# Patient Record
Sex: Female | Born: 1980 | Hispanic: No | Marital: Single | Smoking: Never smoker
Health system: Southern US, Community
[De-identification: ages and names within clinical notes are randomized; demographics above are authoritative.]

## PROBLEM LIST (undated history)

## (undated) DIAGNOSIS — O09529 Supervision of elderly multigravida, unspecified trimester: Secondary | ICD-10-CM

## (undated) DIAGNOSIS — Z8661 Personal history of infections of the central nervous system: Secondary | ICD-10-CM

## (undated) DIAGNOSIS — Z8619 Personal history of other infectious and parasitic diseases: Secondary | ICD-10-CM

## (undated) DIAGNOSIS — R87629 Unspecified abnormal cytological findings in specimens from vagina: Secondary | ICD-10-CM

## (undated) DIAGNOSIS — A609 Anogenital herpesviral infection, unspecified: Secondary | ICD-10-CM

## (undated) DIAGNOSIS — R112 Nausea with vomiting, unspecified: Secondary | ICD-10-CM

## (undated) DIAGNOSIS — F419 Anxiety disorder, unspecified: Secondary | ICD-10-CM

## (undated) DIAGNOSIS — M545 Low back pain, unspecified: Secondary | ICD-10-CM

## (undated) DIAGNOSIS — F40240 Claustrophobia: Secondary | ICD-10-CM

## (undated) DIAGNOSIS — Z9889 Other specified postprocedural states: Secondary | ICD-10-CM

## (undated) DIAGNOSIS — D649 Anemia, unspecified: Secondary | ICD-10-CM

## (undated) HISTORY — DX: Anogenital herpesviral infection, unspecified: A60.9

## (undated) HISTORY — DX: Supervision of elderly multigravida, unspecified trimester: O09.529

## (undated) HISTORY — DX: Unspecified abnormal cytological findings in specimens from vagina: R87.629

## (undated) HISTORY — PX: COLPOSCOPY: SHX161

## (undated) HISTORY — DX: Personal history of other infectious and parasitic diseases: Z86.19

## (undated) HISTORY — DX: Personal history of infections of the central nervous system: Z86.61

## (undated) HISTORY — DX: Other specified postprocedural states: Z98.890

---
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
## 2012-04-13 DEATH — deceased

## 2015-04-19 LAB — OB RESULTS CONSOLE GC/CHLAMYDIA
Chlamydia: NEGATIVE
Gonorrhea: NEGATIVE

## 2015-04-19 LAB — OB RESULTS CONSOLE RPR: RPR: NONREACTIVE

## 2015-04-19 LAB — OB RESULTS CONSOLE HEPATITIS B SURFACE ANTIGEN: HEP B S AG: NEGATIVE

## 2015-04-19 LAB — OB RESULTS CONSOLE HIV ANTIBODY (ROUTINE TESTING): HIV: NONREACTIVE

## 2015-04-19 LAB — OB RESULTS CONSOLE ABO/RH: RH TYPE: POSITIVE

## 2015-04-19 LAB — OB RESULTS CONSOLE ANTIBODY SCREEN: Antibody Screen: NEGATIVE

## 2015-04-19 LAB — OB RESULTS CONSOLE RUBELLA ANTIBODY, IGM: RUBELLA: IMMUNE

## 2015-08-14 NOTE — L&D Delivery Note (Signed)
Vtx at +3 station and ROA.  Pt desires VE.  I discussed the R&B of VE including but not limited to injury to fetus but the potential benefit of expedited delivery.  She gives her informed consent and wishes to proceed. On the 3rd pull delivered viable female apgars 8,9 over 2nd degree ML lac   Placenta delivered spontaneously intact with 3VC. Repair with 2-0 Chromic with good support and hemostasis noted and R/V exam confirms.  PH art was sent.  [place] cord blood was not done.  Mother and baby were doing well.  EBL 250cc  Candice Campavid Wille Aubuchon, MD

## 2015-10-20 LAB — OB RESULTS CONSOLE GBS: GBS: NEGATIVE

## 2015-11-04 ENCOUNTER — Telehealth (HOSPITAL_COMMUNITY): Payer: Self-pay | Admitting: *Deleted

## 2015-11-04 ENCOUNTER — Encounter (HOSPITAL_COMMUNITY): Payer: Self-pay | Admitting: *Deleted

## 2015-11-04 ENCOUNTER — Encounter (HOSPITAL_COMMUNITY): Payer: Self-pay

## 2015-11-04 NOTE — Telephone Encounter (Signed)
Preadmission screen  

## 2015-11-09 ENCOUNTER — Encounter (HOSPITAL_COMMUNITY): Payer: Self-pay | Admitting: *Deleted

## 2015-11-09 ENCOUNTER — Inpatient Hospital Stay (HOSPITAL_COMMUNITY)
Admission: AD | Admit: 2015-11-09 | Discharge: 2015-11-09 | Disposition: A | Discharge status: Home / Self Care | Payer: Managed Care, Other (non HMO) | Source: Ambulatory Visit | Attending: Obstetrics and Gynecology | Admitting: Obstetrics and Gynecology

## 2015-11-09 DIAGNOSIS — Z3493 Encounter for supervision of normal pregnancy, unspecified, third trimester: Secondary | ICD-10-CM | POA: Diagnosis present

## 2015-11-09 NOTE — Discharge Instructions (Signed)
Keep your scheduled appointment for prenatal care and/or C/S. Call the office or provider on call with further concerns or return to MAU as needed.

## 2015-11-09 NOTE — MAU Note (Signed)
Urine in lab 

## 2015-11-09 NOTE — MAU Note (Signed)
woke up at 0330 with pain in lower right side of back, down into hip. Next pain was at 0430.  Not as strong as it was initially.  Denies bleeding or leaking.  For Rc/s on Friday

## 2015-11-10 ENCOUNTER — Inpatient Hospital Stay (HOSPITAL_COMMUNITY)
Admission: RE | Admit: 2015-11-10 | Discharge: 2015-11-10 | Disposition: A | Discharge status: Home / Self Care | Payer: Self-pay | Source: Ambulatory Visit

## 2015-11-10 ENCOUNTER — Inpatient Hospital Stay (HOSPITAL_COMMUNITY): Payer: Managed Care, Other (non HMO) | Admitting: Anesthesiology

## 2015-11-10 ENCOUNTER — Inpatient Hospital Stay (HOSPITAL_COMMUNITY)
Admission: AD | Admit: 2015-11-10 | Discharge: 2015-11-12 | DRG: 774 | Disposition: A | Discharge status: Home / Self Care | Payer: Managed Care, Other (non HMO) | Source: Ambulatory Visit | Attending: Obstetrics and Gynecology | Admitting: Obstetrics and Gynecology

## 2015-11-10 ENCOUNTER — Encounter (HOSPITAL_COMMUNITY): Payer: Self-pay | Admitting: *Deleted

## 2015-11-10 ENCOUNTER — Inpatient Hospital Stay (HOSPITAL_COMMUNITY): Payer: Managed Care, Other (non HMO)

## 2015-11-10 DIAGNOSIS — Z818 Family history of other mental and behavioral disorders: Secondary | ICD-10-CM

## 2015-11-10 DIAGNOSIS — Z3689 Encounter for other specified antenatal screening: Secondary | ICD-10-CM

## 2015-11-10 DIAGNOSIS — O4202 Full-term premature rupture of membranes, onset of labor within 24 hours of rupture: Secondary | ICD-10-CM | POA: Diagnosis present

## 2015-11-10 DIAGNOSIS — Z8661 Personal history of infections of the central nervous system: Secondary | ICD-10-CM

## 2015-11-10 DIAGNOSIS — A6000 Herpesviral infection of urogenital system, unspecified: Secondary | ICD-10-CM | POA: Diagnosis present

## 2015-11-10 DIAGNOSIS — O9832 Other infections with a predominantly sexual mode of transmission complicating childbirth: Secondary | ICD-10-CM | POA: Diagnosis present

## 2015-11-10 DIAGNOSIS — O34211 Maternal care for low transverse scar from previous cesarean delivery: Secondary | ICD-10-CM | POA: Diagnosis present

## 2015-11-10 DIAGNOSIS — Z3A39 39 weeks gestation of pregnancy: Secondary | ICD-10-CM

## 2015-11-10 DIAGNOSIS — Z833 Family history of diabetes mellitus: Secondary | ICD-10-CM | POA: Diagnosis not present

## 2015-11-10 DIAGNOSIS — IMO0001 Reserved for inherently not codable concepts without codable children: Secondary | ICD-10-CM

## 2015-11-10 DIAGNOSIS — Z8249 Family history of ischemic heart disease and other diseases of the circulatory system: Secondary | ICD-10-CM

## 2015-11-10 LAB — TYPE AND SCREEN
ABO/RH(D): A POS
ANTIBODY SCREEN: NEGATIVE

## 2015-11-10 LAB — CBC
HEMATOCRIT: 32.8 % — AB (ref 36.0–46.0)
HEMOGLOBIN: 11.4 g/dL — AB (ref 12.0–15.0)
MCH: 31.9 pg (ref 26.0–34.0)
MCHC: 34.8 g/dL (ref 30.0–36.0)
MCV: 91.9 fL (ref 78.0–100.0)
Platelets: 186 10*3/uL (ref 150–400)
RBC: 3.57 MIL/uL — ABNORMAL LOW (ref 3.87–5.11)
RDW: 13.7 % (ref 11.5–15.5)
WBC: 10.1 10*3/uL (ref 4.0–10.5)

## 2015-11-10 LAB — RPR: RPR: NONREACTIVE

## 2015-11-10 LAB — ABO/RH: ABO/RH(D): A POS

## 2015-11-10 MED ORDER — OXYCODONE-ACETAMINOPHEN 5-325 MG PO TABS: 2.0000 | ORAL_TABLET | ORAL | Status: DC | PRN

## 2015-11-10 MED ORDER — PHENYLEPHRINE 40 MCG/ML (10ML) SYRINGE FOR IV PUSH (FOR BLOOD PRESSURE SUPPORT)
80.0000 ug | PREFILLED_SYRINGE | INTRAVENOUS | Status: DC | PRN
  Filled 2015-11-10: qty 2
  Filled 2015-11-10: qty 20

## 2015-11-10 MED ORDER — ONDANSETRON HCL 4 MG/2ML IJ SOLN: 4.0000 mg | INTRAMUSCULAR | Status: DC | PRN

## 2015-11-10 MED ORDER — SENNOSIDES-DOCUSATE SODIUM 8.6-50 MG PO TABS
2.0000 | ORAL_TABLET | ORAL | Status: DC
  Administered 2015-11-10 – 2015-11-11 (×2): 2 via ORAL
  Filled 2015-11-10 (×2): qty 2

## 2015-11-10 MED ORDER — BENZOCAINE-MENTHOL 20-0.5 % EX AERO
1.0000 "application " | INHALATION_SPRAY | CUTANEOUS | Status: DC | PRN
  Filled 2015-11-10: qty 56

## 2015-11-10 MED ORDER — LACTATED RINGERS IV SOLN
INTRAVENOUS | Status: DC
  Administered 2015-11-10: 09:00:00 via INTRAVENOUS

## 2015-11-10 MED ORDER — ONDANSETRON HCL 4 MG/2ML IJ SOLN: 4.0000 mg | Freq: Four times a day (QID) | INTRAMUSCULAR | Status: DC | PRN

## 2015-11-10 MED ORDER — LACTATED RINGERS IV SOLN: 500.0000 mL | Freq: Once | INTRAVENOUS | Status: DC

## 2015-11-10 MED ORDER — EPHEDRINE 5 MG/ML INJ
10.0000 mg | INTRAVENOUS | Status: DC | PRN
  Filled 2015-11-10: qty 2

## 2015-11-10 MED ORDER — IBUPROFEN 600 MG PO TABS
600.0000 mg | ORAL_TABLET | Freq: Four times a day (QID) | ORAL | Status: DC
  Administered 2015-11-10 – 2015-11-12 (×7): 600 mg via ORAL
  Filled 2015-11-10 (×7): qty 1

## 2015-11-10 MED ORDER — OXYCODONE-ACETAMINOPHEN 5-325 MG PO TABS
1.0000 | ORAL_TABLET | ORAL | Status: DC | PRN
  Filled 2015-11-10: qty 1

## 2015-11-10 MED ORDER — DIPHENHYDRAMINE HCL 50 MG/ML IJ SOLN: 12.5000 mg | INTRAMUSCULAR | Status: DC | PRN

## 2015-11-10 MED ORDER — ACETAMINOPHEN 325 MG PO TABS: 650.0000 mg | ORAL_TABLET | ORAL | Status: DC | PRN

## 2015-11-10 MED ORDER — MEDROXYPROGESTERONE ACETATE 150 MG/ML IM SUSP
150.0000 mg | INTRAMUSCULAR | Status: DC | PRN
Start: 2015-11-10 — End: 2015-11-12

## 2015-11-10 MED ORDER — SIMETHICONE 80 MG PO CHEW: 80.0000 mg | CHEWABLE_TABLET | ORAL | Status: DC | PRN

## 2015-11-10 MED ORDER — TETANUS-DIPHTH-ACELL PERTUSSIS 5-2.5-18.5 LF-MCG/0.5 IM SUSP: 0.5000 mL | Freq: Once | INTRAMUSCULAR | Status: DC

## 2015-11-10 MED ORDER — LIDOCAINE HCL (PF) 1 % IJ SOLN
INTRAMUSCULAR | Status: DC | PRN
  Administered 2015-11-10 (×2): 4 mL

## 2015-11-10 MED ORDER — ACETAMINOPHEN 325 MG PO TABS
650.0000 mg | ORAL_TABLET | ORAL | Status: DC | PRN
  Administered 2015-11-11: 650 mg via ORAL
  Filled 2015-11-10: qty 2

## 2015-11-10 MED ORDER — LIDOCAINE HCL (PF) 1 % IJ SOLN
30.0000 mL | INTRAMUSCULAR | Status: DC | PRN
  Filled 2015-11-10: qty 30

## 2015-11-10 MED ORDER — PHENYLEPHRINE 40 MCG/ML (10ML) SYRINGE FOR IV PUSH (FOR BLOOD PRESSURE SUPPORT)
80.0000 ug | PREFILLED_SYRINGE | INTRAVENOUS | Status: DC | PRN
  Filled 2015-11-10: qty 2

## 2015-11-10 MED ORDER — DIBUCAINE 1 % RE OINT: 1.0000 "application " | TOPICAL_OINTMENT | RECTAL | Status: DC | PRN

## 2015-11-10 MED ORDER — OXYTOCIN BOLUS FROM INFUSION
500.0000 mL | INTRAVENOUS | Status: DC
  Administered 2015-11-10: 500 mL via INTRAVENOUS

## 2015-11-10 MED ORDER — ZOLPIDEM TARTRATE 5 MG PO TABS: 5.0000 mg | ORAL_TABLET | Freq: Every evening | ORAL | Status: DC | PRN

## 2015-11-10 MED ORDER — MEASLES, MUMPS & RUBELLA VAC ~~LOC~~ INJ
0.5000 mL | INJECTION | Freq: Once | SUBCUTANEOUS | Status: DC
Start: 2015-11-11 — End: 2015-11-11

## 2015-11-10 MED ORDER — DIPHENHYDRAMINE HCL 25 MG PO CAPS: 25.0000 mg | ORAL_CAPSULE | Freq: Four times a day (QID) | ORAL | Status: DC | PRN

## 2015-11-10 MED ORDER — FENTANYL 2.5 MCG/ML BUPIVACAINE 1/10 % EPIDURAL INFUSION (WH - ANES)
14.0000 mL/h | INTRAMUSCULAR | Status: DC | PRN
  Administered 2015-11-10 (×3): 14 mL/h via EPIDURAL
  Filled 2015-11-10 (×2): qty 125

## 2015-11-10 MED ORDER — LANOLIN HYDROUS EX OINT: TOPICAL_OINTMENT | CUTANEOUS | Status: DC | PRN

## 2015-11-10 MED ORDER — LACTATED RINGERS IV SOLN: 500.0000 mL | INTRAVENOUS | Status: DC | PRN

## 2015-11-10 MED ORDER — CITRIC ACID-SODIUM CITRATE 334-500 MG/5ML PO SOLN: 30.0000 mL | ORAL | Status: DC | PRN

## 2015-11-10 MED ORDER — OXYTOCIN 10 UNIT/ML IJ SOLN
2.5000 [IU]/h | INTRAVENOUS | Status: DC
  Filled 2015-11-10: qty 10

## 2015-11-10 MED ORDER — ONDANSETRON HCL 4 MG PO TABS: 4.0000 mg | ORAL_TABLET | ORAL | Status: DC | PRN

## 2015-11-10 MED ORDER — WITCH HAZEL-GLYCERIN EX PADS: 1.0000 "application " | MEDICATED_PAD | CUTANEOUS | Status: DC | PRN

## 2015-11-10 MED ORDER — PRENATAL MULTIVITAMIN CH
1.0000 | ORAL_TABLET | Freq: Every day | ORAL | Status: DC
  Administered 2015-11-11 – 2015-11-12 (×2): 1 via ORAL
  Filled 2015-11-10 (×2): qty 1

## 2015-11-10 MED ORDER — OXYCODONE-ACETAMINOPHEN 5-325 MG PO TABS: 1.0000 | ORAL_TABLET | ORAL | Status: DC | PRN

## 2015-11-10 NOTE — MAU Note (Signed)
Pt may go to room 164 after shift change.

## 2015-11-10 NOTE — Anesthesia Procedure Notes (Signed)
Epidural Patient location during procedure: OB  Staffing Anesthesiologist: Shenae Bonanno Performed by: anesthesiologist   Preanesthetic Checklist Completed: patient identified, site marked, surgical consent, pre-op evaluation, timeout performed, IV checked, risks and benefits discussed and monitors and equipment checked  Epidural Patient position: sitting Prep: site prepped and draped and DuraPrep Patient monitoring: continuous pulse ox and blood pressure Approach: midline Location: L3-L4 Injection technique: LOR saline  Needle:  Needle type: Tuohy  Needle gauge: 17 G Needle length: 9 cm and 9 Needle insertion depth: 6.5 cm Catheter type: closed end flexible Catheter size: 19 Gauge Catheter at skin depth: 11 cm Test dose: negative  Assessment Events: blood not aspirated, injection not painful, no injection resistance, negative IV test and no paresthesia  Additional Notes Patient identified. Risks/Benefits/Options discussed with patient including but not limited to bleeding, infection, nerve damage, paralysis, failed block, incomplete pain control, headache, blood pressure changes, nausea, vomiting, reactions to medication both or allergic, itching and postpartum back pain. Confirmed with bedside nurse the patient's most recent platelet count. Confirmed with patient that they are not currently taking any anticoagulation, have any bleeding history or any family history of bleeding disorders. Patient expressed understanding and wished to proceed. All questions were answered. Sterile technique was used throughout the entire procedure. Please see nursing notes for vital signs. Test dose was given through epidural catheter and negative prior to continuing to dose epidural or start infusion. Warning signs of high block given to the patient including shortness of breath, tingling/numbness in hands, complete motor block, or any concerning symptoms with instructions to call for help. Patient was  given instructions on fall risk and not to get out of bed. All questions and concerns addressed with instructions to call with any issues or inadequate analgesia.

## 2015-11-10 NOTE — Anesthesia Preprocedure Evaluation (Signed)
Anesthesia Evaluation  Patient identified by MRN, date of birth, ID band Patient awake    Reviewed: Allergy & Precautions, NPO status , Patient's Chart, lab work & pertinent test results  History of Anesthesia Complications Negative for: history of anesthetic complications  Airway Mallampati: II  TM Distance: >3 FB Neck ROM: Full    Dental no notable dental hx. (+) Dental Advisory Given   Pulmonary neg pulmonary ROS,    Pulmonary exam normal breath sounds clear to auscultation       Cardiovascular negative cardio ROS Normal cardiovascular exam Rhythm:Regular Rate:Normal     Neuro/Psych negative neurological ROS  negative psych ROS   GI/Hepatic negative GI ROS, Neg liver ROS,   Endo/Other  orders  Renal/GU negative Renal ROS  negative genitourinary   Musculoskeletal negative musculoskeletal ROS (+)   Abdominal   Peds negative pediatric ROS (+)  Hematology negative hematology ROS (+)   Anesthesia Other Findings   Reproductive/Obstetrics (+) Pregnancy Prior C/S x 1                             Anesthesia Physical Anesthesia Plan  ASA: II  Anesthesia Plan: Epidural   Post-op Pain Management:    Induction:   Airway Management Planned:   Additional Equipment:   Intra-op Plan:   Post-operative Plan:   Informed Consent: I have reviewed the patients History and Physical, chart, labs and discussed the procedure including the risks, benefits and alternatives for the proposed anesthesia with the patient or authorized representative who has indicated his/her understanding and acceptance.   Dental advisory given  Plan Discussed with: CRNA  Anesthesia Plan Comments:         Anesthesia Quick Evaluation

## 2015-11-10 NOTE — MAU Note (Addendum)
PT  SAYS SROM  AT 0320.    SAYS UC  HURT   BAD   AT  0315.   VE IN MAU YESTERDAY-   CLOSED    HAS HX  OF  HSV-  NEVER  HAD OUTBREAK-   TAKES  ACYCLOVIR,   GBS- NEG       IS  SCH  C/S    UNLESS GOES INTO  LABOR-   SO  NOW PLAN  IS  VAG  WITH  EPIDURAL

## 2015-11-10 NOTE — Progress Notes (Signed)
Darcus Austinarole Michelaski, RN spoke will BS charge rn for status update of room for patient.  Patient will be next to go.

## 2015-11-10 NOTE — H&P (Signed)
Becky Ford is a 35 y.o. female presenting for SROM at 0320 clear fluid and labor sxs.  Preg complicated by ama with normal NIPT and prev c/s scheduled for repeat tomorrow, but wants TOL now.  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  GBS-. History OB History    Gravida Para Term Preterm AB TAB SAB Ectopic Multiple Living   3    1 1    1      Past Medical History  Diagnosis Date  . Vaginal Pap smear, abnormal   . HSV (herpes simplex virus) anogenital infection   . Hx of viral meningitis   . Hx of varicella   . AMA (advanced maternal age) multigravida 35+    Past Surgical History  Procedure Laterality Date  . Cesarean section    . Colposcopy     Family History: family history includes Bipolar disorder in her mother; Cancer in her maternal aunt, maternal grandfather, and maternal grandmother; Diabetes in her maternal grandfather; Heart disease in her maternal grandfather and maternal uncle; Hypertension in her father and paternal grandfather; Seizures in her maternal uncle. Social History:  reports that she has never smoked. She has never used smokeless tobacco. She reports that she does not drink alcohol or use illicit drugs.   Prenatal Transfer Tool  Maternal Diabetes: No Genetic Screening: Normal Maternal Ultrasounds/Referrals: Normal Fetal Ultrasounds or other Referrals:  None Maternal Substance Abuse:  No Significant Maternal Medications:  None Significant Maternal Lab Results:  None Other Comments:  None  ROS  Dilation: 10 Effacement (%): 100 Station: 0 Exam by:: Foye ClockS. Oklesh RN Blood pressure 113/59, pulse 70, temperature 97.6 F (36.4 C), temperature source Oral, resp. rate 18, height 5\' 1"  (1.549 m), weight 162 lb (73.483 kg), last menstrual period 02/07/2015, SpO2 99 %. Exam Physical Exam  Prenatal labs: ABO, Rh: --/--/A POS, A POS (03/30 0714) Antibody: NEG (03/30 0714) Rubella: Immune (09/06 0000) RPR: Non Reactive (03/30 0714)  HBsAg: Negative (09/06  0000)  HIV: Non-reactive (09/06 0000)  GBS: Negative (03/09 0000)   Assessment/Plan: IUP at term ACtive labor with SROM Prev LSTCS desires TOL.  Discussed R&B and informed consent obtained. No HSV lesions. Anticipate SVD   [name] C 11/10/2015, 4:20 PM

## 2015-11-11 ENCOUNTER — Inpatient Hospital Stay (HOSPITAL_COMMUNITY): Admission: AD | Admit: 2015-11-11 | Payer: 59 | Source: Ambulatory Visit | Admitting: Obstetrics and Gynecology

## 2015-11-11 LAB — CBC
HCT: 28 % — ABNORMAL LOW (ref 36.0–46.0)
HEMOGLOBIN: 9.6 g/dL — AB (ref 12.0–15.0)
MCH: 31.9 pg (ref 26.0–34.0)
MCHC: 34.3 g/dL (ref 30.0–36.0)
MCV: 93 fL (ref 78.0–100.0)
PLATELETS: 171 10*3/uL (ref 150–400)
RBC: 3.01 MIL/uL — AB (ref 3.87–5.11)
RDW: 14.1 % (ref 11.5–15.5)
WBC: 16.9 10*3/uL — AB (ref 4.0–10.5)

## 2015-11-11 SURGERY — Surgical Case
Anesthesia: Regional

## 2015-11-11 NOTE — Progress Notes (Signed)
Post Partum Day 1 Subjective: no complaints, up ad lib, voiding and tolerating PO  Objective: Blood pressure 95/51, pulse 66, temperature 97.9 F (36.6 C), temperature source Oral, resp. rate 18, height 5\' 1"  (1.549 m), weight 162 lb (73.483 kg), last menstrual period 02/07/2015, SpO2 99 %, unknown if currently breastfeeding.  Physical Exam:  General: alert and cooperative Lochia: appropriate Uterine Fundus: firm Incision: healing well DVT Evaluation: No evidence of DVT seen on physical exam. Negative Homan's sign. No cords or calf tenderness. No significant calf/ankle edema.   Recent Labs  11/10/15 0714 11/11/15 0457  HGB 11.4* 9.6*  HCT 32.8* 28.0*    Assessment/Plan: Plan for discharge tomorrow and Circumcision prior to discharge   LOS: 1 day   Jeryl Wilbourn G 11/11/2015, 7:59 AM

## 2015-11-11 NOTE — Lactation Note (Signed)
This note was copied from a baby's chart. Lactation Consultation Note Mom has a 36 yr old daughter which she BF for 6 months. States this baby is latching well. Mom asked for a NS d.t using one w/her 1st child. Has everted short shaft nipples. RN gave #20 NS that fits appropriately. Mom stated baby BF for 25 minutes. Encouraged to listen for swallows and to look for colostrum in NS. Encouraged to call RN to assess latch for next feeding.Mom encouraged to feed baby 8-12 times/24 hours and with feeding cues. Referred to Baby and Me Book in Breastfeeding section Pg. 22-23 for position options and Proper latch demonstration. Mom taught how to apply & clean nipple shield. Educated about newborn behavior, STS, I&O, cluster feeding, supply and demand. WH/LC brochure given w/resources, support groups and LC services. Patient Name: Becky Ford [id number]'s Date: 11/11/2015 Reason for consult: Initial assessment   Maternal Data    Feeding Feeding Type: Breast Fed Length of feed: 25 min  LATCH Score/Interventions Latch: Grasps breast easily, tongue down, lips flanged, rhythmical sucking. Intervention(s): Adjust position;Assist with latch  Audible Swallowing: A few with stimulation Intervention(s): Skin to skin;Hand expression  Type of Nipple: Everted at rest and after stimulation  Comfort (Breast/Nipple): Soft / non-tender     Hold (Positioning): Assistance needed to correctly position infant at breast and maintain latch. Intervention(s): Breastfeeding basics reviewed;Support Pillows;Position options;Skin to skin  LATCH Score: 8  Lactation Tools Discussed/Used Tools: Nipple Shields Nipple shield size: 20   Consult Status Consult Status: Follow-up Date: 11/11/15 (in pm) Follow-up type: In-patient    Daniyah Fohl, Diamond NickelLAURA G 11/11/2015, 1:25 AM

## 2015-11-11 NOTE — Lactation Note (Signed)
This note was copied from a baby's chart. Lactation Consultation Note  Patient Name: Becky Ford, follow up consult with this mom and term baby, now 2019 hours old. The baby has not fed in about 8 hours, as per mom. He was waking, in his crib, so I assisted mom with latching him. He was awake, but not interested in latching or sucking. Mom agreed to having a DEP set up, and I showed mom how t add hand expression after pumping. Mom to feed EBM to baby as supplement. Kathie RhodesBetty , RN, was to set up pump for mom, in initiation setting. Mom knows to call for questions/concerns.    Maternal Data    Feeding Feeding Type: Breast Fed Length of feed: 8 min  LATCH Score/Interventions Latch: Too sleepy or reluctant, no latch achieved, no sucking elicited. Intervention(s): Skin to skin;Teach feeding cues;Waking techniques Intervention(s): Adjust position;Assist with latch;Breast massage;Breast compression  Audible Swallowing: None  Type of Nipple: Everted at rest and after stimulation (short shaft, using shield on left)  Comfort (Breast/Nipple): Soft / non-tender     Hold (Positioning): Assistance needed to correctly position infant at breast and maintain latch. Intervention(s): Breastfeeding basics reviewed;Support Pillows;Position options;Skin to skin  LATCH Score: 5  Lactation Tools Discussed/Used Pump Review: Setup, frequency, and cleaning;Milk Storage;Other (comment) Initiated by:: c Virga Haltiwanger RN, IBCLC Date initiated:: 11/11/15   Consult Status Consult Status: Follow-up Date: 11/12/15 Follow-up type: In-patient    Becky Ford, Becky Ford 11/11/2015, 2:36 PM

## 2015-11-11 NOTE — Anesthesia Postprocedure Evaluation (Signed)
Anesthesia Post Note  Patient: Becky Ford  Procedure(s) Performed: * No procedures listed *  Patient location during evaluation: Mother Baby Anesthesia Type: Epidural Level of consciousness: awake and alert and oriented Pain management: satisfactory to patient Vital Signs Assessment: post-procedure vital signs reviewed and stable Respiratory status: spontaneous breathing and nonlabored ventilation Cardiovascular status: stable Postop Assessment: no headache, no backache, no signs of nausea or vomiting, adequate PO intake and patient able to bend at knees (patient up walking) Anesthetic complications: no    Last Vitals:  Filed Vitals:   11/11/15 0619 11/11/15 0620  BP: 98/57 95/51  Pulse: 66   Temp: 36.6 C   Resp: 18     Last Pain:  Filed Vitals:   11/11/15 0620  PainSc: 0-No pain                 [name]

## 2015-11-11 NOTE — Discharge Instructions (Signed)
°Iron-Rich Diet ° °Iron is a mineral that helps your body to produce hemoglobin. Hemoglobin is a protein in your red blood cells that carries oxygen to your body's tissues. Eating too little iron may cause you to feel weak and tired, and it can increase your risk for infection. Eating enough iron is necessary for your body's metabolism, muscle function, and nervous system. °Iron is naturally found in many foods. It can also be added to foods or fortified in foods. There are two types of dietary iron: °· Heme iron. Heme iron is absorbed by the body more easily than nonheme iron. Heme iron is found in meat, poultry, and fish. °· Nonheme iron. Nonheme iron is found in dietary supplements, iron-fortified grains, beans, and vegetables. °You may need to follow an iron-rich diet if: °· You have been diagnosed with iron deficiency or iron-deficiency anemia. °· You have a condition that prevents you from absorbing dietary iron, such as: °¨ Infection in your intestines. °¨ Celiac disease. This involves long-lasting (chronic) inflammation of your intestines. °· You do not eat enough iron. °· You eat a diet that is high in foods that impair iron absorption. °· You have lost a lot of blood. °· You have heavy bleeding during your menstrual cycle. °· You are pregnant. °WHAT IS MY PLAN? °Your health care provider may help you to determine how much iron you need per day based on your condition. Generally, when a person consumes sufficient amounts of iron in the diet, the following iron needs are met: °· Men. °¨ 14-18 years old: 11 mg per day. °¨ 19-50 years old: 8 mg per day. °· Women.   °¨ 14-18 years old: 15 mg per day. °¨ 19-50 years old: 18 mg per day. °¨ Over 50 years old: 8 mg per day. °¨ Pregnant women: 27 mg per day. °¨ Breastfeeding women: 9 mg per day. °WHAT DO I NEED TO KNOW ABOUT AN IRON-RICH DIET? °· Eat fresh fruits and vegetables that are high in vitamin C along with foods that are high in iron. This will help  increase the amount of iron that your body absorbs from food, especially with foods containing nonheme iron. Foods that are high in vitamin C include oranges, peppers, tomatoes, and mango. °· Take iron supplements only as directed by your health care provider. Overdose of iron can be life-threatening. If you were prescribed iron supplements, take them with orange juice or a vitamin C supplement. °· Cook foods in pots and pans that are made from iron.   °· Eat nonheme iron-containing foods alongside foods that are high in heme iron. This helps to improve your iron absorption.   °· Certain foods and drinks contain compounds that impair iron absorption. Avoid eating these foods in the same meal as iron-rich foods or with iron supplements. These include: °¨ Coffee, black tea, and red wine. °¨ Milk, dairy products, and foods that are high in calcium. °¨ Beans, soybeans, and peas. °¨ Whole grains. °· When eating foods that contain both nonheme iron and compounds that impair iron absorption, follow these tips to absorb iron better.   °¨ Soak beans overnight before cooking. °¨ Soak whole grains overnight and drain them before using. °¨ Ferment flours before baking, such as using yeast in bread dough. °WHAT FOODS CAN I EAT? °Grains  °Iron-fortified breakfast cereal. Iron-fortified whole-wheat bread. Enriched rice. Sprouted grains. °Vegetables  °Spinach. Potatoes with skin. Green peas. Broccoli. Red and green bell peppers. Fermented vegetables. °Fruits  °Prunes. Raisins. Oranges. Strawberries. Mango. Grapefruit. °Meats and Other   Protein Sources Beef liver. Oysters. Beef. Shrimp. [place]. Chicken. [place]. Sardines. Chickpeas. Nuts. Tofu. Beverages Tomato juice. Fresh orange juice. Prune juice. Hibiscus tea. Fortified instant breakfast shakes. Condiments Tahini. Fermented soy sauce. Sweets and Desserts Black-strap molasses.  Other Wheat germ. The items listed above may not be a complete list of recommended foods or  beverages. Contact your dietitian for more options. WHAT FOODS ARE NOT RECOMMENDED? Grains Whole grains. Bran cereal. Bran flour. Oats. Vegetables Artichokes. Brussels sprouts. Kale. Fruits Blueberries. Raspberries. Strawberries. Figs. Meats and Other Protein Sources Soybeans. Products made from soy protein. Dairy Milk. Cream. Cheese. Yogurt. Cottage cheese. Beverages Coffee. Black tea. Red wine. Sweets and Desserts Cocoa. Chocolate. Ice cream. Other Basil. Oregano. Parsley. The items listed above may not be a complete list of foods and beverages to avoid. Contact your dietitian for more information.   This information is not intended to replace advice given to you by your health care provider. Make sure you discuss any questions you have with your health care provider.   Document Released: 03/13/2005 Document Revised: 08/20/2014 Document Reviewed: 02/24/2014 Elsevier Interactive Patient Education [employer].

## 2015-11-12 MED ORDER — IBUPROFEN 600 MG PO TABS: 600.0000 mg | ORAL_TABLET | Freq: Four times a day (QID) | ORAL | Status: AC

## 2015-11-12 NOTE — Lactation Note (Signed)
This note was copied from a baby's chart. Lactation Consultation Note: Mother states that breastfeeding is going good. She states that she is no longer using the nipple shield. She states she is hearing ifnant  Swallow. Mother advised to follow instructions in baby and me book on treatment plan to prevent severe engorgement. . Mother to continue to breastfeed infant 8-12 times in 24 hours. [facility]   Patient Name: Becky Ford Reason for consult: Follow-up assessment   Maternal Data    Feeding Feeding Type: Breast Fed Length of feed: 20 min  LATCH Score/Interventions                      Lactation Tools Discussed/Used     Consult Status Consult Status: Complete    Becky Ford, Becky Ford, 12:35 PM

## 2015-11-21 NOTE — Discharge Summary (Signed)
Obstetric Discharge Summary Reason for Admission: rupture of membranes Prenatal Procedures: ultrasound Intrapartum Procedures: vacuum Postpartum Procedures: none Complications-Operative and Postpartum: 2 degree perineal laceration HEMOGLOBIN  Date Value Ref Range Status  11/11/2015 9.6* 12.0 - 15.0 g/dL Final   HCT  Date Value Ref Range Status  11/11/2015 28.0* 36.0 - 46.0 % Final    Physical Exam:  General: alert and cooperative Lochia: appropriate Uterine Fundus: firm Incision: healing well DVT Evaluation: No evidence of DVT seen on physical exam. Negative Homan's sign. No cords or calf tenderness. No significant calf/ankle edema.  Discharge Diagnoses: Term Pregnancy-delivered  Discharge Information: Date: 11/21/2015 Activity: pelvic rest Diet: routine Medications: PNV and Ibuprofen Condition: stable Instructions: refer to practice specific booklet Discharge to: home Follow-up Information    Schedule an appointment as soon as possible for a visit in 6 weeks to follow up.      Newborn Data: Live born female  Birth Weight: 8 lb 1.8 oz (3680 g) APGAR: 8, 9  Home with mother.  Becky Ford G 11/21/2015, 8:00 AM

## 2016-01-02 ENCOUNTER — Encounter: Payer: Self-pay | Admitting: Genetic Counselor

## 2016-01-27 ENCOUNTER — Other Ambulatory Visit (INDEPENDENT_AMBULATORY_CARE_PROVIDER_SITE_OTHER): Payer: Self-pay

## 2016-02-15 ENCOUNTER — Telehealth: Payer: Self-pay | Admitting: Genetic Counselor

## 2016-02-15 NOTE — Telephone Encounter (Signed)
Ms. Becky Ford called to cancel her genetic counseling appt that is scheduled for tomorrow morning.  She will call back to reschedule at a later date.  I gave her my direct office number to do so when she is ready.

## 2016-02-16 ENCOUNTER — Encounter: Payer: Managed Care, Other (non HMO) | Admitting: Genetic Counselor

## 2016-02-16 ENCOUNTER — Other Ambulatory Visit: Payer: Managed Care, Other (non HMO)

## 2016-04-18 ENCOUNTER — Encounter (INDEPENDENT_AMBULATORY_CARE_PROVIDER_SITE_OTHER): Payer: Self-pay | Admitting: Neurological Surgery

## 2016-04-20 ENCOUNTER — Other Ambulatory Visit: Payer: Self-pay

## 2016-04-20 ENCOUNTER — Ambulatory Visit (INDEPENDENT_AMBULATORY_CARE_PROVIDER_SITE_OTHER): Payer: 59 | Admitting: Neurological Surgery

## 2016-04-20 ENCOUNTER — Encounter (INDEPENDENT_AMBULATORY_CARE_PROVIDER_SITE_OTHER): Payer: Self-pay | Admitting: Neurological Surgery

## 2016-04-20 ENCOUNTER — Inpatient Hospital Stay
Admission: RE | Admit: 2016-04-20 | Discharge: 2016-04-20 | Disposition: A | Discharge status: Home / Self Care | Payer: Self-pay | Source: Ambulatory Visit

## 2016-04-20 VITALS — Wt 132.0 lb

## 2016-04-20 DIAGNOSIS — M5412 Radiculopathy, cervical region: Secondary | ICD-10-CM | POA: Insufficient documentation

## 2016-04-20 NOTE — Progress Notes (Signed)
Review of Systems   Constitutional: Positive for fatigue.   HENT: Positive for congestion, sinus pressure and sneezing.    Eyes: Negative.    Respiratory: Negative.    Cardiovascular: Negative.    Gastrointestinal: Negative.    Endocrine: Negative.    Genitourinary: Negative.    Musculoskeletal: Positive for myalgias, back pain, joint swelling, neck pain and neck stiffness.   Skin: Negative.    Allergic/Immunologic: Negative.    Neurological: Positive for weakness (off/on) and numbness.   Hematological: Negative.    Psychiatric/Behavioral: Negative.

## 2016-04-20 NOTE — Progress Notes (Addendum)
[facility] Neurosurgery  New Patient Note  Primary Care MD: No primary care provider on file.  MRN: [id number]  HPI     Chief Complaint   Patient presents with   . Initial Consult   . Neck Pain     HPI    Patient ID: Jennifer Mason is a 35 y.o. right handed female, DOB November 30, 1980 who presented to our clinic for consultation about her neck and arm pain. She states that her neck pain started 2066 and progressively got worse. Her pain radiated to her left shoulder and left arm down to her fingers and is associated with a "funny feeling" down to her arm, 10/10, constant, aggravates with sitting, driving and uneven weight distribution and alleviates with not moving. She has tried PT for a few months while at one of the PT sessions, one of the maneuvers caused severe pain when she had an MRI c spine spine. She denies changing of her handwriting. She denies bladder and bowel dysfunction, she complains of imbalance.  She complains of right sided low back pain radiating down to her thigh proximal to her knee.   She used to be an Proofreader and carrying heavy goggles, vests and head gears.    Medical History   History reviewed. No pertinent past medical history.    Surgical History     Past Surgical History   Procedure Laterality Date   . Wisdom tooth extraction     . Sinus surgery     . Augmentation, breast, (cosmetic)         Family History     Family History   Problem Relation Age of Onset   . Cancer Mother         Social History     Social History     Social History   . Marital Status: Unknown     Spouse Name: N/A   . Number of Children: N/A   . Years of Education: N/A     Occupational History   . Not on file.     Social History Main Topics   . Smoking status: Never Smoker    . Smokeless tobacco: Never Used   . Alcohol Use: Yes   . Drug Use: No   . Sexual Activity: Not on file     Other Topics Concern   . Not on file     Social History Narrative   . No narrative on file       Current Medications     Current  Outpatient Prescriptions   Medication Sig Dispense Refill   . DiphenhydrAMINE HCl (BENADRYL PO) Take by mouth.     . fluticasone (FLONASE) 50 MCG/ACT nasal spray 1 spray by Nasal route daily.     . Prenatal Vit-Fe Fumarate-FA (PRENATAL PO) Take by mouth.       No current facility-administered medications for this visit.       Allergies   No Known Allergies    Review of Systems   Review of Systems  Constitutional: Positive for fatigue.   HENT: Positive for congestion, sinus pressure and sneezing.   Eyes: Negative.   Respiratory: Negative.   Cardiovascular: Negative.   Gastrointestinal: Negative.   Endocrine: Negative.   Genitourinary: Negative.   Musculoskeletal: Positive for myalgias, back pain, joint swelling, neck pain and neck stiffness.   Skin: Negative.   Allergic/Immunologic: Negative.   Neurological: Positive for weakness (off/on) and numbness.   Hematological: Negative.   Psychiatric/Behavioral: Negative.  Physical Examination   VITAL SIGNS:   weight is 59.875 kg (132 lb).          General:  Well developed, well nourished, no apparent distress  Neck:  Supple, no JVD, no apparent lymphadenopathy  HEENT:  Head normocephalic, atraumatic, no obvious lesions in ear, nose or throat  Chest:  Equal chest rise.  No wheezes, rales or rhonchi.  Skin:  No obvious lesions or scars  Extremities:  Without clubbing or cyanosis    Neurologic Exam  Awake, alert, oriented x3, Follows commands  GCS: 15   Motor Exam:     R L   L2-3 Iliopsoas (Hip Flexion)  5 5  L3-4 Quadriceps (Knee Extension)  5 5  L5-S1 Hamstring (Knee Flexion)  5 5  L4-5 Tibialis Anterior (Foot Dorsiflexion) 5 5  S1 Gastrocsoleus (Plantar Flexion) 5 5  L5 EHL (Toe Dorsiflexion)  5 5    C5 Deltoid(Arm Abduction)   5 5  C5-6 Biceps (Elbow Flexion)  5 4  C6-7 Triceps (Elbow Extension)  5 5    C6-7 Wrist Extension   5 +4  C7 Wrist Flexion    5 +4  C8 Grip     5 5    Point tenderness: None   Sensation intact bilaterlly  Reflexes:   Biceps (C5-6): right 2+,  left 2+  Brachioradialis C5-6): right  2+ , left 2+,  Patellar (L3-4): right 2+, left 2+,  Hoffman's: right negative, left negative  Tandem gait: Normal  Romberg: Normal  Radiology Interpretation   C spine MRI wo contrast at [facility] 01/27/2016:  1- Prominent left-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  2- Mild diffuse bulging of the C5-6 disc  Impression   I have evaluated and counseled Jennifer Mason, regarding her condition, prognosis and treatment options. She presents with neck pain and atypical sensations in her left arm, including tingling and numbess. She distinguishes this from pain, per se. She does have some weakness in her left arm, that includes the biceps and wrist function. 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 I have had an extensive discussion with her, regarding her options. Given the size of the disc, and the concordance of her symptoms (mild weakness, and "sensations") in her left arm, I would favor surgical intervention, via a C6-7 ACDF. Jennifer Mason requests additional testing to confirm that this disc is pathologic, and therefore, we have recommended an EMG/NCV study, which if positive would confirm the diagnosis; but if negative, may only be related to its sensitivity. We will see her back once the EMG is complete.    I have personally reviewed and confirmed the history and physical examination, and synthesized the medical decision making. This is a Level 4 [phone]) outpatient consultation of moderate complexity, that is medically necessary in the consideration of major neurological surgery.  The encounter is summarized below:     History Comprehensive     CC Required    X   HPI Extended 4-8 elements, or status of 3 chronic conditions    X   ROS 10 + elements    X   PFSH 3+ elements    X   Examination Comprehensive     Organ Systems 8+    X   Medical Decision  Making High     Number of Dx/Mgmt Opts  Extensive (3) Sum points:  -Self-limited  or minor (1);   -Established problem, stable or improving (1);  -Established problem, worsening (2)   -New problem, no w/u (3),   -New problem, with w/u (4)   X   Data Review  Extensive (3) Sum points:  - Review or order clinical lab testsr (1);   - Review or order radiology test (1);    - Review or order medicine test (1)  - Discuss test with performing physician (1)  - Independent Review of imaging (2)   - Decision to obtain old records (1)   - Review and summation of old records (2)   X   Risk  Moderate Presence of one of the following:  -One or more chronic illness, with mild exacerbation or progression  -Two or more chronic illnesses  - Undiagnosed new problem with uncertain prognosis  - Acute illness with systemic symptoms  - Acute complicated injury   -Obtain fluid from body cavity  -Minor surgery with risk factors  -Elective major surgery (open, percutaneous,  endoscopic), with no  identified risk factors  -Emergency major surgery (open,  percutaneous, endoscopic)  -Prescription drug management     X   Severity of Problem  Moderate to High    X           Plan   EMG BUE  No orders of the defined types were placed in this encounter.     Follow-up     Jacquees Gongora Ander Slade, MD   [facility] DNP, ACNP-BC

## 2016-04-27 ENCOUNTER — Ambulatory Visit (INDEPENDENT_AMBULATORY_CARE_PROVIDER_SITE_OTHER): Payer: 59 | Admitting: Neurological Surgery

## 2016-05-01 ENCOUNTER — Encounter (INDEPENDENT_AMBULATORY_CARE_PROVIDER_SITE_OTHER): Payer: Self-pay | Admitting: Nurse Practitioner

## 2016-05-01 NOTE — Progress Notes (Signed)
Called the patient to follow up on her EMG and she needs help with making appointment. Left her a message to call me back.    Demetra Shiner NP

## 2016-05-11 ENCOUNTER — Other Ambulatory Visit: Payer: Self-pay | Admitting: Physical Medicine & Rehabilitation

## 2016-05-11 DIAGNOSIS — M50123 Cervical disc disorder at C6-C7 level with radiculopathy: Secondary | ICD-10-CM

## 2016-05-11 DIAGNOSIS — M47812 Spondylosis without myelopathy or radiculopathy, cervical region: Secondary | ICD-10-CM

## 2016-05-15 ENCOUNTER — Telehealth: Payer: 59

## 2016-05-16 ENCOUNTER — Encounter (INDEPENDENT_AMBULATORY_CARE_PROVIDER_SITE_OTHER): Payer: Self-pay

## 2016-05-16 ENCOUNTER — Ambulatory Visit (INDEPENDENT_AMBULATORY_CARE_PROVIDER_SITE_OTHER): Payer: 59 | Admitting: Neurological Surgery

## 2016-05-16 VITALS — BP 111/72 | HR 57 | Wt 130.0 lb

## 2016-05-16 DIAGNOSIS — M5412 Radiculopathy, cervical region: Secondary | ICD-10-CM

## 2016-05-17 NOTE — Progress Notes (Signed)
[facility] Neurosurgery  Follow up Note    Previous Impression/Plan       HPI     Chief Complaint   Patient presents with   . Follow-up     f/u w/EMG   No interval changes, except for some improvement in her left arm symptoms possibly related to positional awareness.     Physical Examination   VITAL SIGNS:   weight is 59 kg (130 lb). Her blood pressure is 111/72 and her pulse is 57 (abnormal).        Neurologic Exam  General: Well-nourished, well-developed female, comfortable in no acute distress.  Mental: Awake, alert, oriented x3.  Visual: Acuity not tested.   Neurologic:  [Cranial] CN 2-12 are grossly intact. Face symmetric, tongue midline, gaze conjugate. [Motor] Full strength, 5/5 throughout. [Sensation] Grossly intact to all modalities. [Reflexes] Symmetric 1+/4 throughout. No Hoffman's or Clonus. Toes downgoing. [Gait] Not tested.     Cardiovascular: Regular rate and rhythm  Pulmonary: Normal work of breathing, no wheezing.  Abdominal:  Non-tender, non-distended.   Back: Well-aligned, no obvious step-off deformities.  No tenderness to palpation  Neck: Full ROM, no focal tenderness.        Review of Systems   Review of Systems    Radiology Interpretation   Mri Imported Outside Images    Result Date: 04/20/2016  The attached image(s) (individually and collectively, "Image") is/are from prior  encounter(s) occurring outside the current encounter at [facility].  The Image was obtained from the patient or patient's treating physician (in some cases, at the request of the patient's referring physician), and is incorporated into the [facility] medical record system for reference for the patient's current encounter and future encounters at [place].  No current interpretation is provided for the Image.      Emg-nerve Conduction Study Scan    Result Date: 05/16/2016  Ordered by an unspecified provider.    Outside Radiology Scan    Result Date: 04/18/2016  Ordered by an unspecified provider.      Impression   Ms.Edelen returns  today for continued evaluation. She has significant disc bulges at C5-6, C6-7 (leftward, more prominent). She has atypical sensations in that left arm, not related to pain. Her EMG is normal at this time. As such, I have recommended a conservative course of management, but educated her on signs and symptoms that would require more prompt evaluation. She would like to follow-up regularly to discuss her symptoms.    I have spent over   (25) minutes in face-to-face time with this patient, and over 50% of this time counseling in one or more of the following areas:  [1] diagnostic test results, impressions, or recommended studies [2] prognosis, [3] risks, benefits, surgical alternatives of treatment options; [4] instructions for treatment and/or follow-up, [5] Importance of compliance with chosen treatment option, [6] Risk factor reduction, and [7] Patient and family education. I spent additional time reviewing the radiography, coordinating care and completing the medical record.       Plan     No orders of the defined types were placed in this encounter.    Follow-up     Ronnette Rump Ander Slade, MD

## 2016-05-23 ENCOUNTER — Ambulatory Visit
Admission: RE | Admit: 2016-05-23 | Discharge: 2016-05-23 | Disposition: A | Discharge status: Home / Self Care | Payer: 59 | Source: Ambulatory Visit | Attending: Physical Medicine & Rehabilitation | Admitting: Physical Medicine & Rehabilitation

## 2016-05-23 HISTORY — DX: Nausea with vomiting, unspecified: R11.2

## 2016-05-23 HISTORY — DX: Anxiety disorder, unspecified: F41.9

## 2016-05-23 HISTORY — DX: Anemia, unspecified: D64.9

## 2016-05-23 HISTORY — DX: Low back pain, unspecified: M54.50

## 2016-05-23 HISTORY — DX: Claustrophobia: F40.240

## 2016-06-12 ENCOUNTER — Encounter (INDEPENDENT_AMBULATORY_CARE_PROVIDER_SITE_OTHER): Payer: 59 | Admitting: Neurology

## 2016-11-27 IMAGING — US US MFM OB LIMITED
1 series · 15 of 23 positions shown · non-contrast
Comparison: none

[Series 1: us mfm ob limited · 15 of 23 slices shown]
[im 1/23]
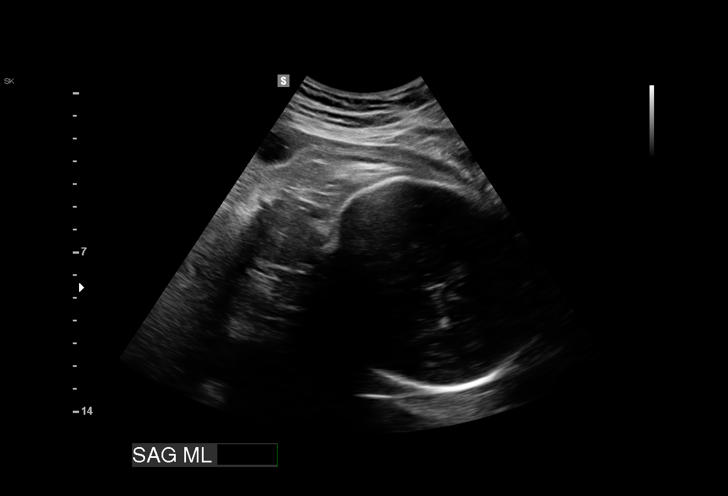
[im 3/23]
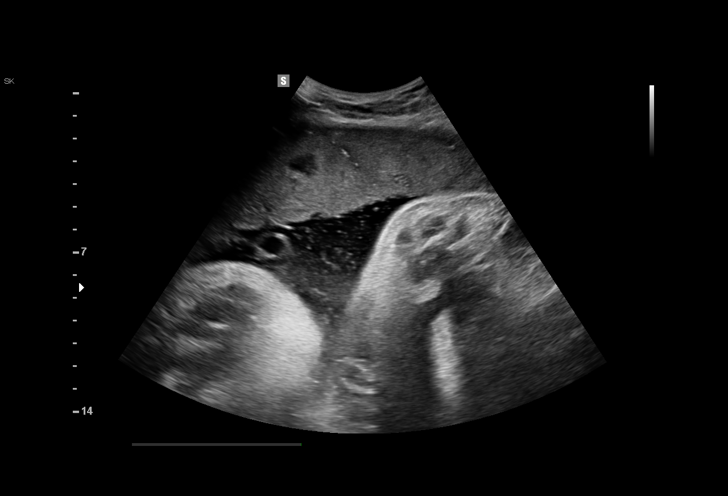
[im 4/23]
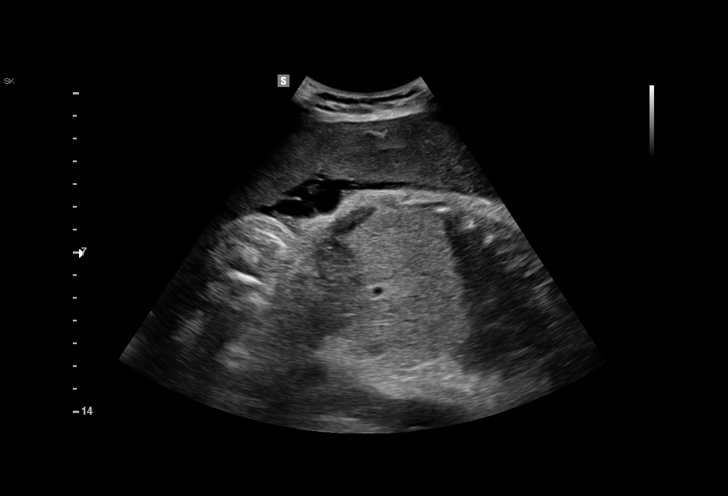
[im 6/23]
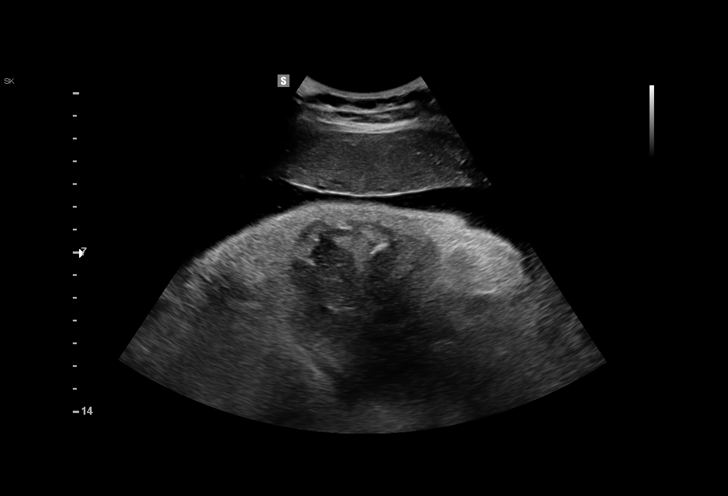
[im 7/23]
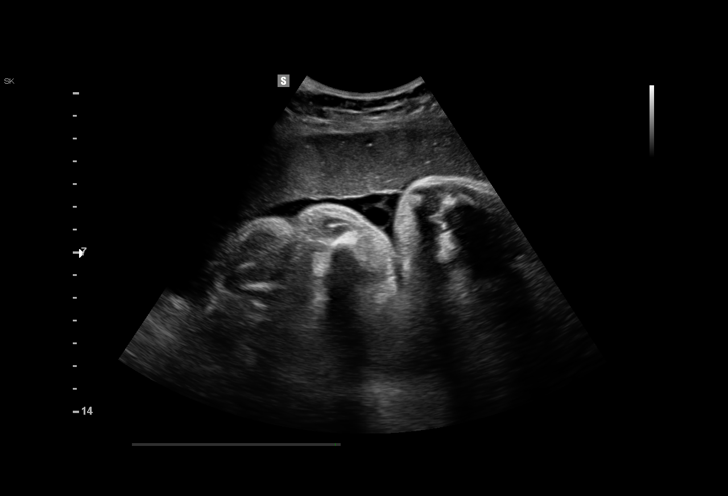
[im 9/23]
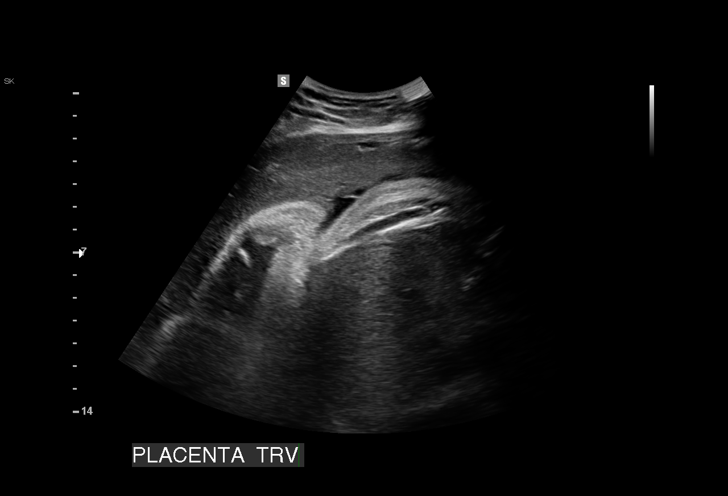
[im 10/23]
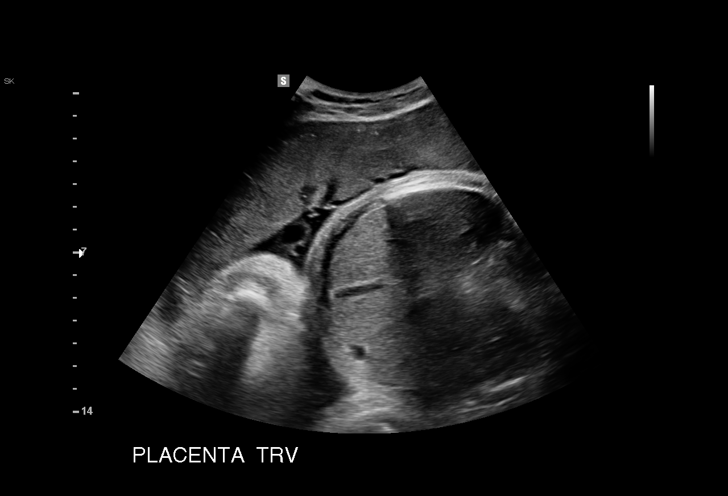
[im 12/23]
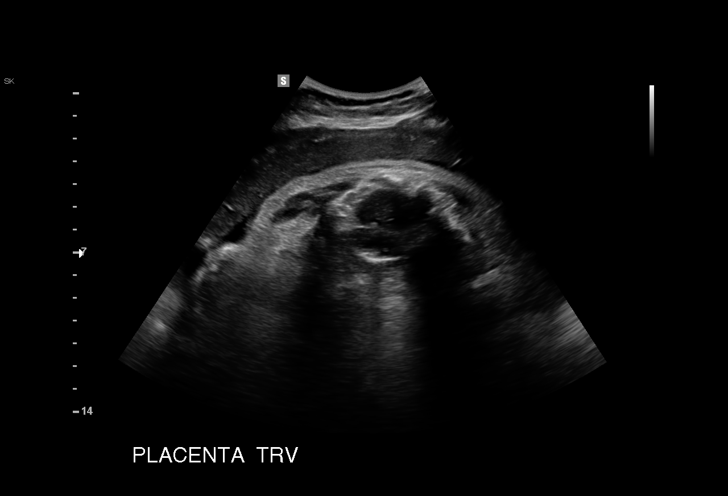
[im 14/23]
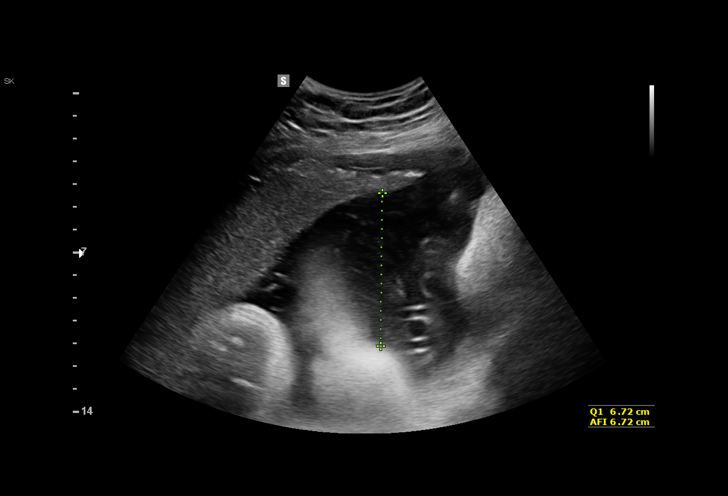
[im 15/23]
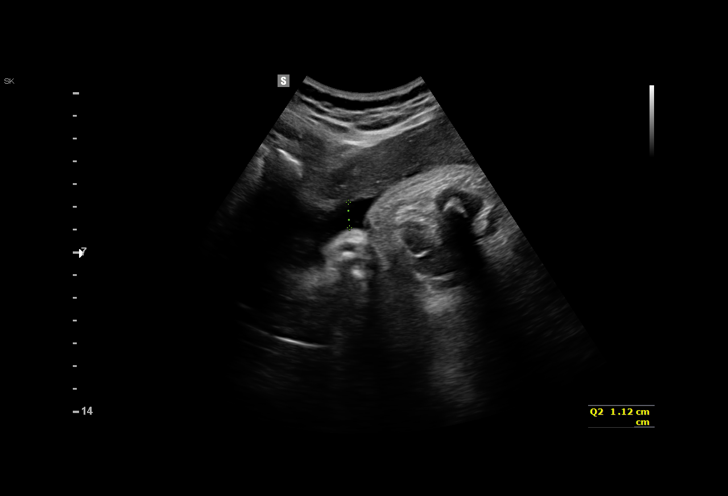
[im 17/23]
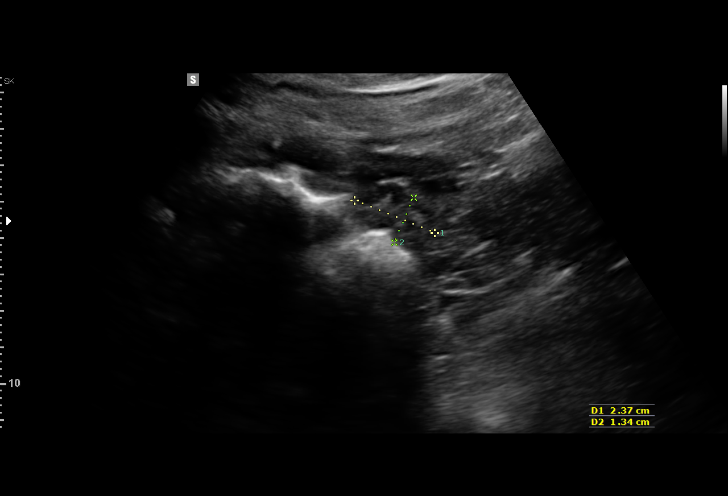
[im 18/23]
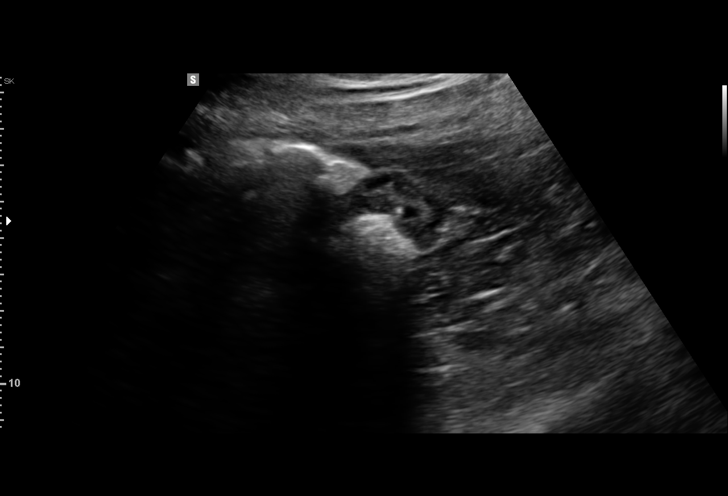
[im 20/23]
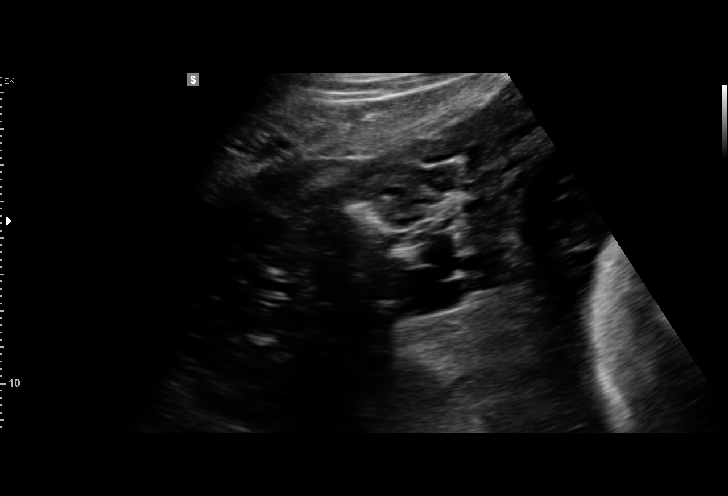
[im 21/23]
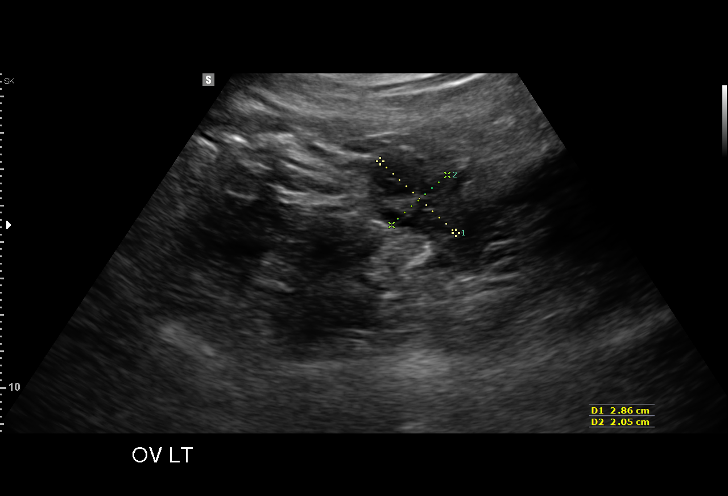
[im 23/23]
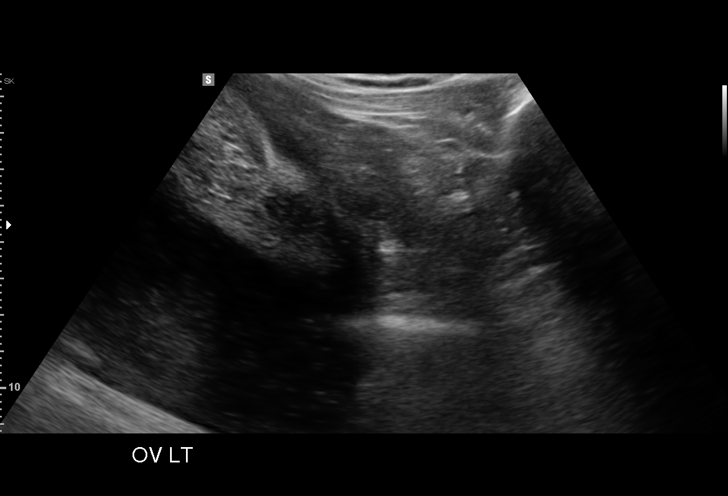

[15 of 23 positions shown; findings below may reference images not displayed]

[address]
[facility] [HOSPITAL]
MAU/Triage

1  [address] JOSHJAX          [id number]      [id number]     [id number]
Indications

Determine fetal presentation using             Z36
ultrasound
39 weeks gestation of pregnancy
OB History

Gravidity:    3         Term:   1
TOP:          1
Fetal Evaluation

Num Of Fetuses:     1
Fetal Heart         138
Rate(bpm):
Cardiac Activity:   Observed
Presentation:       Cephalic
Placenta:           Anterior, above cervical os

Amniotic Fluid
AFI FV:      Subjectively within normal limits
AFI Sum:     14.96   cm      61   %Tile     Larg Pckt:   7.12   cm
RUQ:   6.72   cm    RLQ:    7.12   cm    LUQ:   1.12    cm
Gestational Age

LMP:           39w 3d       Date:   02/07/15                 EDD:   11/14/15
Best:          39w 3d    Det. By:   LMP  (02/07/15)          EDD:   11/14/15
Cervix Uterus Adnexa

Cervix
Not visualized (advanced GA >77wks)

Left Ovary
Within normal limits.

Right Ovary
Within normal limits.
Impression

SIUP at 39+3 weeks
Cephalic presentation
Normal amniotic fluid volume
Recommendations

Follow-up as clinically indicated
